# Patient Record
Sex: Female | Born: 1950 | Race: White | Hispanic: No | Marital: Single | Smoking: Never smoker
Health system: Southern US, Community
[De-identification: ages and names within clinical notes are randomized; demographics above are authoritative.]

## PROBLEM LIST (undated history)

## (undated) DIAGNOSIS — C801 Malignant (primary) neoplasm, unspecified: Secondary | ICD-10-CM

## (undated) DIAGNOSIS — I10 Essential (primary) hypertension: Secondary | ICD-10-CM

## (undated) DIAGNOSIS — E079 Disorder of thyroid, unspecified: Secondary | ICD-10-CM

## (undated) DIAGNOSIS — E119 Type 2 diabetes mellitus without complications: Secondary | ICD-10-CM

## (undated) HISTORY — DX: Disorder of thyroid, unspecified: E07.9

## (undated) HISTORY — DX: Type 2 diabetes mellitus without complications: E11.9

## (undated) HISTORY — DX: Malignant (primary) neoplasm, unspecified: C80.1

## (undated) HISTORY — DX: Essential (primary) hypertension: I10

---

## 2006-06-15 DIAGNOSIS — C801 Malignant (primary) neoplasm, unspecified: Secondary | ICD-10-CM

## 2006-06-15 HISTORY — DX: Malignant (primary) neoplasm, unspecified: C80.1

## 2006-06-15 HISTORY — PX: BREAST LUMPECTOMY: SHX2

## 2010-12-25 DIAGNOSIS — C50919 Malignant neoplasm of unspecified site of unspecified female breast: Secondary | ICD-10-CM | POA: Insufficient documentation

## 2010-12-26 ENCOUNTER — Other Ambulatory Visit: Payer: Self-pay | Admitting: Family Medicine

## 2010-12-26 DIAGNOSIS — Z1231 Encounter for screening mammogram for malignant neoplasm of breast: Secondary | ICD-10-CM

## 2011-03-02 ENCOUNTER — Other Ambulatory Visit: Payer: Self-pay | Admitting: Family Medicine

## 2011-03-02 ENCOUNTER — Ambulatory Visit
Admission: RE | Admit: 2011-03-02 | Discharge: 2011-03-02 | Disposition: A | Discharge status: Home / Self Care | Payer: Self-pay | Source: Ambulatory Visit | Attending: Family Medicine | Admitting: Family Medicine

## 2011-03-02 DIAGNOSIS — Z1231 Encounter for screening mammogram for malignant neoplasm of breast: Secondary | ICD-10-CM

## 2011-03-02 DIAGNOSIS — Z853 Personal history of malignant neoplasm of breast: Secondary | ICD-10-CM

## 2012-05-25 DIAGNOSIS — R739 Hyperglycemia, unspecified: Secondary | ICD-10-CM | POA: Insufficient documentation

## 2012-05-25 DIAGNOSIS — E782 Mixed hyperlipidemia: Secondary | ICD-10-CM | POA: Insufficient documentation

## 2012-06-27 ENCOUNTER — Other Ambulatory Visit: Payer: Self-pay | Admitting: Family Medicine

## 2012-06-27 DIAGNOSIS — N63 Unspecified lump in unspecified breast: Secondary | ICD-10-CM

## 2012-07-07 ENCOUNTER — Ambulatory Visit
Admission: RE | Admit: 2012-07-07 | Discharge: 2012-07-07 | Disposition: A | Discharge status: Home / Self Care | Payer: No Typology Code available for payment source | Source: Ambulatory Visit | Attending: Family Medicine | Admitting: Family Medicine

## 2012-07-07 DIAGNOSIS — N63 Unspecified lump in unspecified breast: Secondary | ICD-10-CM

## 2013-08-15 DIAGNOSIS — I10 Essential (primary) hypertension: Secondary | ICD-10-CM | POA: Insufficient documentation

## 2013-08-15 DIAGNOSIS — E039 Hypothyroidism, unspecified: Secondary | ICD-10-CM | POA: Insufficient documentation

## 2013-10-18 ENCOUNTER — Other Ambulatory Visit: Payer: Self-pay | Admitting: Family Medicine

## 2013-10-18 DIAGNOSIS — Z853 Personal history of malignant neoplasm of breast: Secondary | ICD-10-CM

## 2013-11-08 ENCOUNTER — Ambulatory Visit
Admission: RE | Admit: 2013-11-08 | Discharge: 2013-11-08 | Disposition: A | Discharge status: Home / Self Care | Payer: No Typology Code available for payment source | Source: Ambulatory Visit | Attending: Family Medicine | Admitting: Family Medicine

## 2013-11-08 DIAGNOSIS — Z853 Personal history of malignant neoplasm of breast: Secondary | ICD-10-CM

## 2015-03-19 ENCOUNTER — Other Ambulatory Visit: Payer: Self-pay | Admitting: Family Medicine

## 2015-03-19 DIAGNOSIS — M549 Dorsalgia, unspecified: Secondary | ICD-10-CM

## 2015-04-17 ENCOUNTER — Ambulatory Visit
Admission: RE | Admit: 2015-04-17 | Discharge: 2015-04-17 | Disposition: A | Discharge status: Home / Self Care | Payer: No Typology Code available for payment source | Source: Ambulatory Visit | Attending: Family Medicine | Admitting: Family Medicine

## 2015-04-17 DIAGNOSIS — M549 Dorsalgia, unspecified: Secondary | ICD-10-CM

## 2015-07-09 ENCOUNTER — Other Ambulatory Visit (HOSPITAL_COMMUNITY)
Admission: RE | Admit: 2015-07-09 | Discharge: 2015-07-09 | Disposition: A | Discharge status: Home / Self Care | Payer: No Typology Code available for payment source | Source: Ambulatory Visit | Attending: Family Medicine | Admitting: Family Medicine

## 2015-07-09 ENCOUNTER — Other Ambulatory Visit: Payer: Self-pay | Admitting: Family Medicine

## 2015-07-09 DIAGNOSIS — Z01419 Encounter for gynecological examination (general) (routine) without abnormal findings: Secondary | ICD-10-CM | POA: Insufficient documentation

## 2015-07-10 LAB — CYTOLOGY - PAP

## 2016-06-15 HISTORY — PX: BREAST BIOPSY: SHX20

## 2016-09-07 ENCOUNTER — Other Ambulatory Visit: Payer: Self-pay | Admitting: Family

## 2016-09-07 DIAGNOSIS — Z853 Personal history of malignant neoplasm of breast: Secondary | ICD-10-CM

## 2016-09-07 DIAGNOSIS — Z1231 Encounter for screening mammogram for malignant neoplasm of breast: Secondary | ICD-10-CM

## 2016-09-22 DIAGNOSIS — I10 Essential (primary) hypertension: Secondary | ICD-10-CM | POA: Diagnosis not present

## 2016-09-22 DIAGNOSIS — E119 Type 2 diabetes mellitus without complications: Secondary | ICD-10-CM | POA: Diagnosis not present

## 2016-09-22 DIAGNOSIS — E039 Hypothyroidism, unspecified: Secondary | ICD-10-CM | POA: Diagnosis not present

## 2016-09-22 DIAGNOSIS — Z23 Encounter for immunization: Secondary | ICD-10-CM | POA: Diagnosis not present

## 2016-09-30 ENCOUNTER — Ambulatory Visit
Admission: RE | Admit: 2016-09-30 | Discharge: 2016-09-30 | Disposition: A | Discharge status: Home / Self Care | Payer: Medicare Other | Source: Ambulatory Visit | Attending: Family | Admitting: Family

## 2016-09-30 DIAGNOSIS — Z853 Personal history of malignant neoplasm of breast: Secondary | ICD-10-CM

## 2016-09-30 DIAGNOSIS — Z1231 Encounter for screening mammogram for malignant neoplasm of breast: Secondary | ICD-10-CM

## 2016-10-02 ENCOUNTER — Other Ambulatory Visit: Payer: Self-pay | Admitting: Family

## 2016-10-02 DIAGNOSIS — R928 Other abnormal and inconclusive findings on diagnostic imaging of breast: Secondary | ICD-10-CM

## 2016-10-07 ENCOUNTER — Other Ambulatory Visit: Payer: Self-pay | Admitting: Family

## 2016-10-07 ENCOUNTER — Ambulatory Visit
Admission: RE | Admit: 2016-10-07 | Discharge: 2016-10-07 | Disposition: A | Discharge status: Home / Self Care | Payer: Medicare Other | Source: Ambulatory Visit | Attending: Family | Admitting: Family

## 2016-10-07 DIAGNOSIS — R921 Mammographic calcification found on diagnostic imaging of breast: Secondary | ICD-10-CM

## 2016-10-07 DIAGNOSIS — R928 Other abnormal and inconclusive findings on diagnostic imaging of breast: Secondary | ICD-10-CM

## 2016-10-08 ENCOUNTER — Ambulatory Visit
Admission: RE | Admit: 2016-10-08 | Discharge: 2016-10-08 | Disposition: A | Discharge status: Home / Self Care | Payer: Medicare Other | Source: Ambulatory Visit | Attending: Family | Admitting: Family

## 2016-10-08 ENCOUNTER — Other Ambulatory Visit: Payer: Self-pay | Admitting: Family

## 2016-10-08 DIAGNOSIS — R921 Mammographic calcification found on diagnostic imaging of breast: Secondary | ICD-10-CM

## 2016-10-08 DIAGNOSIS — N6489 Other specified disorders of breast: Secondary | ICD-10-CM | POA: Diagnosis not present

## 2016-10-09 DIAGNOSIS — Z0000 Encounter for general adult medical examination without abnormal findings: Secondary | ICD-10-CM | POA: Diagnosis not present

## 2016-10-09 DIAGNOSIS — Z0184 Encounter for antibody response examination: Secondary | ICD-10-CM | POA: Diagnosis not present

## 2017-02-26 DIAGNOSIS — E119 Type 2 diabetes mellitus without complications: Secondary | ICD-10-CM | POA: Diagnosis not present

## 2017-02-26 DIAGNOSIS — M545 Low back pain: Secondary | ICD-10-CM | POA: Diagnosis not present

## 2017-02-26 DIAGNOSIS — E039 Hypothyroidism, unspecified: Secondary | ICD-10-CM | POA: Diagnosis not present

## 2017-05-31 DIAGNOSIS — I10 Essential (primary) hypertension: Secondary | ICD-10-CM | POA: Diagnosis not present

## 2017-05-31 DIAGNOSIS — E039 Hypothyroidism, unspecified: Secondary | ICD-10-CM | POA: Diagnosis not present

## 2017-05-31 DIAGNOSIS — Z1211 Encounter for screening for malignant neoplasm of colon: Secondary | ICD-10-CM | POA: Diagnosis not present

## 2017-05-31 DIAGNOSIS — E119 Type 2 diabetes mellitus without complications: Secondary | ICD-10-CM | POA: Diagnosis not present

## 2017-05-31 DIAGNOSIS — Z79899 Other long term (current) drug therapy: Secondary | ICD-10-CM | POA: Diagnosis not present

## 2017-07-07 DIAGNOSIS — Z1211 Encounter for screening for malignant neoplasm of colon: Secondary | ICD-10-CM | POA: Diagnosis not present

## 2018-04-20 ENCOUNTER — Telehealth: Payer: Self-pay | Admitting: Obstetrics and Gynecology

## 2018-04-20 NOTE — Telephone Encounter (Signed)
Called and left a message for patient to call back to schedule a new patient doctor referral appointment with our office to see Dr. Quincy Simmonds to establish care. Confirm referral reason with patient.

## 2018-05-09 ENCOUNTER — Ambulatory Visit (INDEPENDENT_AMBULATORY_CARE_PROVIDER_SITE_OTHER): Payer: Medicare Other | Admitting: Obstetrics and Gynecology

## 2018-05-09 ENCOUNTER — Other Ambulatory Visit: Payer: Self-pay

## 2018-05-09 ENCOUNTER — Other Ambulatory Visit (HOSPITAL_COMMUNITY)
Admission: RE | Admit: 2018-05-09 | Discharge: 2018-05-09 | Disposition: A | Discharge status: Home / Self Care | Payer: Medicare Other | Source: Ambulatory Visit | Attending: Obstetrics and Gynecology | Admitting: Obstetrics and Gynecology

## 2018-05-09 ENCOUNTER — Other Ambulatory Visit: Payer: Self-pay | Admitting: Family Medicine

## 2018-05-09 ENCOUNTER — Encounter: Payer: Self-pay | Admitting: Obstetrics and Gynecology

## 2018-05-09 VITALS — BP 164/100 | HR 70 | Resp 18 | Ht 63.0 in | Wt 132.8 lb

## 2018-05-09 DIAGNOSIS — Z1231 Encounter for screening mammogram for malignant neoplasm of breast: Secondary | ICD-10-CM

## 2018-05-09 DIAGNOSIS — Z01419 Encounter for gynecological examination (general) (routine) without abnormal findings: Secondary | ICD-10-CM | POA: Insufficient documentation

## 2018-05-09 DIAGNOSIS — Z113 Encounter for screening for infections with a predominantly sexual mode of transmission: Secondary | ICD-10-CM

## 2018-05-09 DIAGNOSIS — R3915 Urgency of urination: Secondary | ICD-10-CM

## 2018-05-09 LAB — POCT URINALYSIS DIPSTICK
Bilirubin, UA: NEGATIVE
Blood, UA: NEGATIVE
Glucose, UA: NEGATIVE
Ketones, UA: NEGATIVE
Leukocytes, UA: NEGATIVE
Nitrite, UA: NEGATIVE
Protein, UA: NEGATIVE
Urobilinogen, UA: 0.2 E.U./dL
pH, UA: 5 (ref 5.0–8.0)

## 2018-05-09 NOTE — Progress Notes (Signed)
67 y.o. [id number] Single Caucasian/Brazilian female here for annual exam.    Daughter present for a large portion of the visit.   Difficulty emptying her bladder at night for a long time, comes and goes, but returned about 2 months ago. She has urgency when she needs to void.  During the day is urinating normally.  DF - every 1 - 1.5 hours. NF - 2 times per night.  No enuresis.  Denies incontinence.   No hx UTI/pyelo.  No hx hematuria or stones.   Some discomfort in pelvis.  Has some right sided back pain when she is trying to empty her bladder and cannot.   Had a normal pelvic US 2014 in [place], [place].  In the past had hx of fibroids.   No vaginal bleeding.   Hx DM.  A1C about 6.7.  Monitoring her sugar when she cannot empty well, and her level is in the 90s.   Last sexual activity was 5 years ago.  Did not use condoms.  No prior STD testing.   PCP:  Riley Lam, MD   Patient's last menstrual period was 06/15/1998.           Sexually active: No.  The current method of family planning is post menopausal status.    Exercising: Yes.    cardo and weights Smoker:  no  Health Maintenance: Pap:  2017 normal per patient History of abnormal Pap:  no MMG:--Hx Lt.Br.CA & lumpectomy 2008--  09-30-16 Lt.Br.calcifications, Rt.Br.Neg/density B--Diag.Lt.Br.shows grouped amorphous calcifications--Lt.Br.Core Bx Path--FIBROADENOMATOID CHANGE WITH CALCIFICATIONS OF LEFT BREAST.  Has mammogram scheduled at [facility] this week.  Colonoscopy: 2006 normal per patient--recent neg IFOB BMD: 02/2018  Result :Osteopenia with PCP TDaP: PCP Gardasil:   no HIV: Pt.thinks had and was neg Hep C:Pt. Thinks had and was neg Screening Labs:  Hb today: PCP, Urine today: negative.    reports that she has never smoked. She has never used smokeless tobacco. She reports that she drank alcohol. She reports that she does not use drugs.  Past Medical History:  Diagnosis Date  . Cancer [facility]) 2008   Lt.Br.Cance  . Diabetes mellitus without complication (HCC)    Type 2  . Hypertension   . Thyroid disease     Past Surgical History:  Procedure Laterality Date  . BREAST LUMPECTOMY Left 2008   w/radiation--done in [place]    Current Outpatient Medications  Medication Sig Dispense Refill  . levothyroxine (SYNTHROID, LEVOTHROID) 50 MCG tablet Take 1 tablet by mouth daily.    . metFORMIN (GLUCOPHAGE) 1000 MG tablet Take 1 tablet by mouth 2 (two) times daily.    . metoprolol tartrate (LOPRESSOR) 50 MG tablet Take 1 tablet by mouth daily.    . simvastatin (ZOCOR) 20 MG tablet Take 1 tablet by mouth daily.     No current facility-administered medications for this visit.     Family History  Problem Relation Age of Onset  . Hypertension Paternal Grandmother   . Diabetes Paternal Grandmother   . Hypertension Paternal Grandfather   . Breast cancer Neg Hx     Review of Systems  Genitourinary: Positive for urgency.  All other systems reviewed and are negative.   Exam:   BP (!) 164/100 (BP Location: Right Arm, Patient Position: Sitting, Cuff Size: Normal)   Pulse 70   Resp 18   Ht 5\' 3"  (1.6 m)   Wt 132 lb 12.8 oz (60.2 kg)   LMP 06/15/1998   BMI 23.52 kg/m  General appearance: alert, cooperative and appears stated age Head: Normocephalic, without obvious abnormality, atraumatic Neck: no adenopathy, supple, symmetrical, trachea midline and thyroid normal to inspection and palpation Lungs: clear to auscultation bilaterally Breasts: right - normal appearance, no masses or tenderness, No nipple retraction or dimpling, No nipple discharge or bleeding, No axillary or supraclavicular adenopathy Left - scar consistent with prior breast surgery.  no masses or tenderness, No nipple discharge or bleeding, No axillary or supraclavicular adenopathy Heart: regular rate and rhythm Abdomen: soft, non-tender; no masses, no organomegaly Extremities: extremities normal, atraumatic, no  cyanosis or edema Skin: Skin color, texture, turgor normal. No rashes or lesions Lymph nodes: Cervical, supraclavicular, and axillary nodes normal. No abnormal inguinal nodes palpated Neurologic: Grossly normal  Pelvic: External genitalia:  no lesions              Urethra:  normal appearing urethra with no masses, tenderness or lesions              Bartholins and Skenes: normal                 Vagina: normal appearing vagina with normal color and discharge, no lesions              Cervix: no lesions              Pap taken: Yes.   Bimanual Exam:  Uterus:  normal size, contour, position, consistency, mobility, non-tender              Adnexa: no mass, fullness, tenderness              Rectal exam: Yes.  .  Confirms.              Anus:  normal sphincter tone, no lesions  Chaperone was present for exam.  Assessment:   Well woman visit with  Gynecologic exam. Hx left breast cancer.  Urinary urgency.  Voiding difficulty.  DM. Osteopenia.  Followed by PCP.  HTN.   Plan: Mammogram screening. Recommended self breast awareness. Pap and HR HPV as above. Guidelines for Calcium, Vitamin D, regular exercise program including cardiovascular and weight bearing exercise. STD screening.  May need referral to urology.  Follow up annually and prn.   After visit summary provided.

## 2018-05-09 NOTE — Patient Instructions (Signed)

## 2018-05-10 LAB — HEP, RPR, HIV PANEL
HIV Screen 4th Generation wRfx: NONREACTIVE
Hepatitis B Surface Ag: NEGATIVE
RPR Ser Ql: NONREACTIVE

## 2018-05-10 LAB — HEPATITIS C ANTIBODY: Hep C Virus Ab: <0.1 s/co ratio (ref 0.0–0.9)

## 2018-05-11 ENCOUNTER — Ambulatory Visit
Admission: RE | Admit: 2018-05-11 | Discharge: 2018-05-11 | Disposition: A | Discharge status: Home / Self Care | Payer: Medicare Other | Source: Ambulatory Visit | Attending: Family Medicine | Admitting: Family Medicine

## 2018-05-11 DIAGNOSIS — Z1231 Encounter for screening mammogram for malignant neoplasm of breast: Secondary | ICD-10-CM

## 2018-05-11 LAB — CYTOLOGY - PAP
Chlamydia: NEGATIVE
Diagnosis: NEGATIVE
Neisseria Gonorrhea: NEGATIVE
Trichomonas: NEGATIVE

## 2018-05-12 ENCOUNTER — Encounter: Payer: Self-pay | Admitting: Obstetrics and Gynecology

## 2018-05-12 DIAGNOSIS — R3915 Urgency of urination: Secondary | ICD-10-CM | POA: Insufficient documentation

## 2018-05-17 ENCOUNTER — Telehealth: Payer: Self-pay

## 2018-05-17 NOTE — Telephone Encounter (Signed)
Spoke with patient. Results given. Patient verbalizes understanding. 02 recall entered. Encounter closed.

## 2018-05-17 NOTE — Telephone Encounter (Signed)
-----   Message from Nunzio Cobbs, MD sent at 05/13/2018  2:09 PM EST ----- Please report results to patient.  You may need a [place] interpretor but I would try calling first. Her pap is normal. Her testing for infectious disease is all negative for HIV, syphilis, hepatitis B and C, trichomonas, gonorrhea, and chlamydia.

## 2019-04-11 ENCOUNTER — Other Ambulatory Visit: Payer: Self-pay | Admitting: Family Medicine

## 2019-04-11 DIAGNOSIS — Z1231 Encounter for screening mammogram for malignant neoplasm of breast: Secondary | ICD-10-CM

## 2019-06-01 ENCOUNTER — Ambulatory Visit
Admission: RE | Admit: 2019-06-01 | Discharge: 2019-06-01 | Disposition: A | Discharge status: Home / Self Care | Payer: Medicare Other | Source: Ambulatory Visit | Attending: Family Medicine | Admitting: Family Medicine

## 2019-06-01 ENCOUNTER — Other Ambulatory Visit: Payer: Self-pay

## 2019-06-01 ENCOUNTER — Ambulatory Visit: Payer: Medicare Other

## 2019-06-01 DIAGNOSIS — Z1231 Encounter for screening mammogram for malignant neoplasm of breast: Secondary | ICD-10-CM

## 2020-05-27 ENCOUNTER — Other Ambulatory Visit: Payer: Self-pay | Admitting: Family Medicine

## 2020-05-27 DIAGNOSIS — Z1231 Encounter for screening mammogram for malignant neoplasm of breast: Secondary | ICD-10-CM

## 2020-07-08 ENCOUNTER — Ambulatory Visit
Admission: RE | Admit: 2020-07-08 | Discharge: 2020-07-08 | Disposition: A | Discharge status: Home / Self Care | Payer: Medicare Other | Source: Ambulatory Visit | Attending: Family Medicine | Admitting: Family Medicine

## 2020-07-08 ENCOUNTER — Other Ambulatory Visit: Payer: Self-pay

## 2020-07-08 DIAGNOSIS — Z1231 Encounter for screening mammogram for malignant neoplasm of breast: Secondary | ICD-10-CM

## 2020-07-18 DIAGNOSIS — D1801 Hemangioma of skin and subcutaneous tissue: Secondary | ICD-10-CM | POA: Diagnosis not present

## 2020-07-18 DIAGNOSIS — L814 Other melanin hyperpigmentation: Secondary | ICD-10-CM | POA: Diagnosis not present

## 2020-07-18 DIAGNOSIS — D239 Other benign neoplasm of skin, unspecified: Secondary | ICD-10-CM | POA: Diagnosis not present

## 2020-07-18 DIAGNOSIS — L720 Epidermal cyst: Secondary | ICD-10-CM | POA: Diagnosis not present

## 2020-07-18 DIAGNOSIS — L821 Other seborrheic keratosis: Secondary | ICD-10-CM | POA: Diagnosis not present

## 2020-08-19 ENCOUNTER — Ambulatory Visit (INDEPENDENT_AMBULATORY_CARE_PROVIDER_SITE_OTHER): Payer: Medicare Other | Admitting: Family Medicine

## 2020-08-19 ENCOUNTER — Other Ambulatory Visit: Payer: Self-pay

## 2020-08-19 ENCOUNTER — Encounter: Payer: Self-pay | Admitting: Family Medicine

## 2020-08-19 VITALS — BP 173/80 | HR 72 | Ht 63.25 in | Wt 126.6 lb

## 2020-08-19 DIAGNOSIS — E039 Hypothyroidism, unspecified: Secondary | ICD-10-CM | POA: Diagnosis not present

## 2020-08-19 DIAGNOSIS — I10 Essential (primary) hypertension: Secondary | ICD-10-CM

## 2020-08-19 DIAGNOSIS — E2839 Other primary ovarian failure: Secondary | ICD-10-CM | POA: Insufficient documentation

## 2020-08-19 DIAGNOSIS — E782 Mixed hyperlipidemia: Secondary | ICD-10-CM

## 2020-08-19 DIAGNOSIS — E7800 Pure hypercholesterolemia, unspecified: Secondary | ICD-10-CM | POA: Insufficient documentation

## 2020-08-19 DIAGNOSIS — E559 Vitamin D deficiency, unspecified: Secondary | ICD-10-CM

## 2020-08-19 DIAGNOSIS — E119 Type 2 diabetes mellitus without complications: Secondary | ICD-10-CM | POA: Diagnosis not present

## 2020-08-19 DIAGNOSIS — Z853 Personal history of malignant neoplasm of breast: Secondary | ICD-10-CM

## 2020-08-19 MED ORDER — METOPROLOL TARTRATE 50 MG PO TABS: 50.0000 mg | ORAL_TABLET | Freq: Two times a day (BID) | ORAL | 1 refills | Status: AC

## 2020-08-19 MED ORDER — METFORMIN HCL 1000 MG PO TABS: 1000.0000 mg | ORAL_TABLET | Freq: Two times a day (BID) | ORAL | 1 refills | Status: DC

## 2020-08-19 NOTE — Progress Notes (Signed)
Office Visit Note   Patient: Amanda Lucas           Date of Birth: [birth year]           MRN: [id number] Visit Date: 08/19/2020 Requested by: Lujean Amel, MD [place],  [place] [id number] PCP: Eunice Blase, MD  Subjective: Chief Complaint  Patient presents with  . Other    Establish primary care Would like some blood work - fasting today    HPI: She's here to establish care.  Feels uncomfortable now when seeing her previous PCP, since she told her she didn't want vaccination.  Her daughter is with her to interpret today.  She has diabetes which has been well controlled with Metformin and lifestyle changes.  She eats very healthfully and rarely ever "cheats" with her diet.  She exercises regularly as well.  No history of retinopathy, nephropathy or neuropathy.  Blood sugars at home are generally below 120 in the mornings.  Her last A1c was around 6.  Her blood pressure is normal when she checks it at home but always elevated when she comes to the doctor.  She is asymptomatic.  She was placed on pravastatin although her lipids were basically normal.  She recently stopped it due to concerns about side effects.  Hypothyroidism has been well controlled.  No current symptoms on 50 mcg daily.  History of breast cancer, treated successfully.  Monitored yearly with mammograms.  Family history reviewed.               ROS:   All other systems were reviewed and are negative.  Objective: Vital Signs: BP (!) 173/80   Pulse 72   Ht 5' 3.25" (1.607 m)   Wt 126 lb 9.6 oz (57.4 kg)   LMP 06/15/1998   BMI 22.25 kg/m   Physical Exam:  General:  Alert and oriented, in no acute distress. Pulm:  Breathing unlabored. Psy:  Normal mood, congruent affect. Skin:  No lesions, including feet.  HEENT:  [place]/AT, PERRLA, EOM Full, no nystagmus.  Funduscopic examination within normal limits.  No conjunctival erythema.  Tympanic membranes are pearly gray with  normal landmarks.  External ear canals are normal.  Nasal passages are clear.  Oropharynx is clear.  No significant lymphadenopathy.  No thyromegaly or nodules.  2+ carotid pulses without bruits. CV: Regular rate and rhythm without murmurs, rubs, or gallops.  No peripheral edema.  2+ radial and posterior tibial pulses. Lungs: Clear to auscultation throughout with no wheezing or areas of consolidation. Abd: Bowel sounds are active, no hepatosplenomegaly or masses.  Soft and nontender.  No audible bruits.  No evidence of ascites. Ext:  2+ UE and LE DTRs.   Imaging: No results found.  Assessment & Plan: 1.  Diabetes, under good control. - Labs today.  Return in 6 months.  2.  Hypothyroid, asymptomatic. - Labs, adjust if needed.  3.  Hypertension - Bring home machine in for calibration.  4.  History of breast cancer - Monitored regularly.     Procedures: No procedures performed        PMFS History: Patient Active Problem List   Diagnosis Date Noted  . Type 2 diabetes mellitus without complications ([place]) 68/05/7516  . Decreased estrogen level 08/19/2020  . Personal history of malignant neoplasm of breast 08/19/2020  . Pure hypercholesterolemia 08/19/2020  . Urinary urgency 05/12/2018  . Essential (primary) hypertension 08/15/2013  . Hypothyroidism, unspecified 08/15/2013  . Mixed hyperlipidemia 05/25/2012  .  Hyperglycemia 05/25/2012  . Malignant neoplasm of female breast ([place]) 12/25/2010   Past Medical History:  Diagnosis Date  . Cancer [facility]) 2008   Lt.Br.Cance  . Diabetes mellitus without complication (HCC)    Type 2  . Hypertension   . Thyroid disease     Family History  Problem Relation Age of Onset  . Cancer Mother   . Brain cancer Mother   . Cancer Father   . Stomach cancer Father   . Healthy Sister   . Healthy Brother   . Hypertension Paternal Grandmother   . Diabetes Paternal Grandmother   . Hypertension Paternal Grandfather   . Healthy Brother   .  Diabetes Paternal Aunt   . Breast cancer Neg Hx   . Heart disease Neg Hx     Past Surgical History:  Procedure Laterality Date  . BREAST LUMPECTOMY Left 2008   w/radiation--done in [place] History   Occupational History  . Not on file  Tobacco Use  . Smoking status: Never Smoker  . Smokeless tobacco: Never Used  Vaping Use  . Vaping Use: Never used  Substance and Sexual Activity  . Alcohol use: Not Currently  . Drug use: Never  . Sexual activity: Not Currently    Birth control/protection: Post-menopausal

## 2020-08-20 ENCOUNTER — Telehealth: Payer: Self-pay | Admitting: Family Medicine

## 2020-08-20 LAB — CBC WITH DIFFERENTIAL/PLATELET
Absolute Monocytes: 475 cells/uL (ref 200–950)
Basophils Absolute: 39 cells/uL (ref 0–200)
Basophils Relative: 0.6 %
Eosinophils Absolute: 98 cells/uL (ref 15–500)
Eosinophils Relative: 1.5 %
HCT: 43.7 % (ref 35.0–45.0)
Hemoglobin: 14.7 g/dL (ref 11.7–15.5)
Lymphs Abs: 2158 cells/uL (ref 850–3900)
MCH: 29.6 pg (ref 27.0–33.0)
MCHC: 33.6 g/dL (ref 32.0–36.0)
MCV: 87.9 fL (ref 80.0–100.0)
MPV: 9.1 fL (ref 7.5–12.5)
Monocytes Relative: 7.3 %
Neutro Abs: 3731 cells/uL (ref 1500–7800)
Neutrophils Relative %: 57.4 %
Platelets: 200 10*3/uL (ref 140–400)
RBC: 4.97 10*6/uL (ref 3.80–5.10)
RDW: 13 % (ref 11.0–15.0)
Total Lymphocyte: 33.2 %
WBC: 6.5 10*3/uL (ref 3.8–10.8)

## 2020-08-20 LAB — COMPREHENSIVE METABOLIC PANEL
AG Ratio: 2.1 (calc) (ref 1.0–2.5)
ALT: 9 U/L (ref 6–29)
AST: 13 U/L (ref 10–35)
Albumin: 4.6 g/dL (ref 3.6–5.1)
Alkaline phosphatase (APISO): 61 U/L (ref 37–153)
BUN: 18 mg/dL (ref 7–25)
CO2: 27 mmol/L (ref 20–32)
Calcium: 9.6 mg/dL (ref 8.6–10.4)
Chloride: 103 mmol/L (ref 98–110)
Creat: 0.69 mg/dL (ref 0.50–0.99)
Globulin: 2.2 g/dL (calc) (ref 1.9–3.7)
Glucose, Bld: 102 mg/dL — ABNORMAL HIGH (ref 65–99)
Potassium: 4.5 mmol/L (ref 3.5–5.3)
Sodium: 138 mmol/L (ref 135–146)
Total Bilirubin: 0.4 mg/dL (ref 0.2–1.2)
Total Protein: 6.8 g/dL (ref 6.1–8.1)

## 2020-08-20 LAB — HIGH SENSITIVITY CRP: hs-CRP: 0.5 mg/L

## 2020-08-20 LAB — LIPID PANEL
Cholesterol: 186 mg/dL (ref <200)
HDL: 56 mg/dL (ref >=50)
LDL Cholesterol (Calc): 105 mg/dL (calc) — ABNORMAL HIGH
Non-HDL Cholesterol (Calc): 130 mg/dL (calc) — ABNORMAL HIGH (ref <130)
Total CHOL/HDL Ratio: 3.3 (calc) (ref <5.0)
Triglycerides: 134 mg/dL (ref <150)

## 2020-08-20 LAB — THYROID PANEL WITH TSH
Free Thyroxine Index: 2.3 (ref 1.4–3.8)
T3 Uptake: 26 % (ref 22–35)
T4, Total: 8.9 ug/dL (ref 5.1–11.9)
TSH: 2.41 mIU/L (ref 0.40–4.50)

## 2020-08-20 LAB — HEMOGLOBIN A1C
Hgb A1c MFr Bld: 6.3 % of total Hgb — ABNORMAL HIGH (ref <5.7)
Mean Plasma Glucose: 134 mg/dL
eAG (mmol/L): 7.4 mmol/L

## 2020-08-20 LAB — MICROALBUMIN, URINE: Microalb, Ur: <0.2 mg/dL

## 2020-08-20 LAB — VITAMIN D 25 HYDROXY (VIT D DEFICIENCY, FRACTURES): Vit D, 25-Hydroxy: 37 ng/mL (ref 30–100)

## 2020-08-20 NOTE — Telephone Encounter (Signed)
Labs show:  Vitamin D borderline at 18.  I recommend an additional vitamin D3 at 2000 IU daily to bring the level closer to 50-80.  Blood glucose looks good at 102, and A1c is still good at 6.3.  It is important to continue exercising regularly and minimizing dietary intake of processed carbohydrates and sweets.  Thyroid function looks good.  All else looks good

## 2020-09-03 ENCOUNTER — Encounter: Payer: Self-pay | Admitting: Family Medicine

## 2020-09-03 MED ORDER — DOXYCYCLINE HYCLATE 100 MG PO CAPS: 100.0000 mg | ORAL_CAPSULE | Freq: Two times a day (BID) | ORAL | 0 refills | Status: AC

## 2020-09-12 DIAGNOSIS — L089 Local infection of the skin and subcutaneous tissue, unspecified: Secondary | ICD-10-CM | POA: Diagnosis not present

## 2020-09-12 DIAGNOSIS — L720 Epidermal cyst: Secondary | ICD-10-CM | POA: Diagnosis not present

## 2020-09-26 DIAGNOSIS — L720 Epidermal cyst: Secondary | ICD-10-CM | POA: Diagnosis not present

## 2021-01-01 ENCOUNTER — Other Ambulatory Visit: Payer: Self-pay | Admitting: Family Medicine

## 2021-01-22 ENCOUNTER — Other Ambulatory Visit: Payer: Self-pay | Admitting: Family Medicine

## 2021-03-03 DIAGNOSIS — Z7984 Long term (current) use of oral hypoglycemic drugs: Secondary | ICD-10-CM | POA: Diagnosis not present

## 2021-03-03 DIAGNOSIS — I10 Essential (primary) hypertension: Secondary | ICD-10-CM | POA: Diagnosis not present

## 2021-03-03 DIAGNOSIS — E1169 Type 2 diabetes mellitus with other specified complication: Secondary | ICD-10-CM | POA: Diagnosis not present

## 2021-03-03 DIAGNOSIS — E7800 Pure hypercholesterolemia, unspecified: Secondary | ICD-10-CM | POA: Diagnosis not present

## 2021-03-03 DIAGNOSIS — R350 Frequency of micturition: Secondary | ICD-10-CM | POA: Diagnosis not present

## 2021-03-03 DIAGNOSIS — E039 Hypothyroidism, unspecified: Secondary | ICD-10-CM | POA: Diagnosis not present

## 2021-05-05 DIAGNOSIS — E1169 Type 2 diabetes mellitus with other specified complication: Secondary | ICD-10-CM | POA: Diagnosis not present

## 2021-05-05 DIAGNOSIS — E039 Hypothyroidism, unspecified: Secondary | ICD-10-CM | POA: Diagnosis not present

## 2021-05-05 DIAGNOSIS — I10 Essential (primary) hypertension: Secondary | ICD-10-CM | POA: Diagnosis not present

## 2021-05-05 DIAGNOSIS — Z7984 Long term (current) use of oral hypoglycemic drugs: Secondary | ICD-10-CM | POA: Diagnosis not present

## 2021-05-05 DIAGNOSIS — E7800 Pure hypercholesterolemia, unspecified: Secondary | ICD-10-CM | POA: Diagnosis not present

## 2021-06-25 ENCOUNTER — Other Ambulatory Visit: Payer: Self-pay | Admitting: Internal Medicine

## 2021-06-25 DIAGNOSIS — Z1231 Encounter for screening mammogram for malignant neoplasm of breast: Secondary | ICD-10-CM

## 2021-07-10 ENCOUNTER — Other Ambulatory Visit: Payer: Self-pay

## 2021-07-10 ENCOUNTER — Ambulatory Visit
Admission: RE | Admit: 2021-07-10 | Discharge: 2021-07-10 | Disposition: A | Discharge status: Home / Self Care | Payer: Medicare Other | Source: Ambulatory Visit | Attending: Internal Medicine | Admitting: Internal Medicine

## 2021-07-10 DIAGNOSIS — Z1231 Encounter for screening mammogram for malignant neoplasm of breast: Secondary | ICD-10-CM

## 2021-09-04 ENCOUNTER — Other Ambulatory Visit: Payer: Self-pay | Admitting: Internal Medicine

## 2021-09-04 DIAGNOSIS — Z853 Personal history of malignant neoplasm of breast: Secondary | ICD-10-CM | POA: Diagnosis not present

## 2021-09-04 DIAGNOSIS — Z1211 Encounter for screening for malignant neoplasm of colon: Secondary | ICD-10-CM | POA: Diagnosis not present

## 2021-09-04 DIAGNOSIS — E039 Hypothyroidism, unspecified: Secondary | ICD-10-CM | POA: Diagnosis not present

## 2021-09-04 DIAGNOSIS — M8588 Other specified disorders of bone density and structure, other site: Secondary | ICD-10-CM

## 2021-09-04 DIAGNOSIS — Z0000 Encounter for general adult medical examination without abnormal findings: Secondary | ICD-10-CM | POA: Diagnosis not present

## 2021-09-04 DIAGNOSIS — E1169 Type 2 diabetes mellitus with other specified complication: Secondary | ICD-10-CM | POA: Diagnosis not present

## 2021-09-04 DIAGNOSIS — I10 Essential (primary) hypertension: Secondary | ICD-10-CM | POA: Diagnosis not present

## 2021-09-04 DIAGNOSIS — E7800 Pure hypercholesterolemia, unspecified: Secondary | ICD-10-CM | POA: Diagnosis not present

## 2021-09-17 DIAGNOSIS — Z1211 Encounter for screening for malignant neoplasm of colon: Secondary | ICD-10-CM | POA: Diagnosis not present

## 2021-10-01 DIAGNOSIS — H524 Presbyopia: Secondary | ICD-10-CM | POA: Diagnosis not present

## 2021-10-01 DIAGNOSIS — H52223 Regular astigmatism, bilateral: Secondary | ICD-10-CM | POA: Diagnosis not present

## 2021-10-01 DIAGNOSIS — H35033 Hypertensive retinopathy, bilateral: Secondary | ICD-10-CM | POA: Diagnosis not present

## 2021-10-01 DIAGNOSIS — H5203 Hypermetropia, bilateral: Secondary | ICD-10-CM | POA: Diagnosis not present

## 2021-11-07 DIAGNOSIS — R195 Other fecal abnormalities: Secondary | ICD-10-CM | POA: Diagnosis not present

## 2022-03-10 DIAGNOSIS — Z7984 Long term (current) use of oral hypoglycemic drugs: Secondary | ICD-10-CM | POA: Diagnosis not present

## 2022-03-10 DIAGNOSIS — Z13820 Encounter for screening for osteoporosis: Secondary | ICD-10-CM | POA: Diagnosis not present

## 2022-03-10 DIAGNOSIS — E7800 Pure hypercholesterolemia, unspecified: Secondary | ICD-10-CM | POA: Diagnosis not present

## 2022-03-10 DIAGNOSIS — E1169 Type 2 diabetes mellitus with other specified complication: Secondary | ICD-10-CM | POA: Diagnosis not present

## 2022-03-10 DIAGNOSIS — I10 Essential (primary) hypertension: Secondary | ICD-10-CM | POA: Diagnosis not present

## 2022-03-10 DIAGNOSIS — E039 Hypothyroidism, unspecified: Secondary | ICD-10-CM | POA: Diagnosis not present

## 2022-03-10 DIAGNOSIS — R195 Other fecal abnormalities: Secondary | ICD-10-CM | POA: Diagnosis not present

## 2022-03-11 ENCOUNTER — Other Ambulatory Visit: Payer: Self-pay | Admitting: Family Medicine

## 2022-03-11 DIAGNOSIS — E2839 Other primary ovarian failure: Secondary | ICD-10-CM

## 2022-05-14 DIAGNOSIS — R195 Other fecal abnormalities: Secondary | ICD-10-CM | POA: Diagnosis not present

## 2022-05-28 DIAGNOSIS — K5730 Diverticulosis of large intestine without perforation or abscess without bleeding: Secondary | ICD-10-CM | POA: Diagnosis not present

## 2022-05-28 DIAGNOSIS — K648 Other hemorrhoids: Secondary | ICD-10-CM | POA: Diagnosis not present

## 2022-05-28 DIAGNOSIS — D122 Benign neoplasm of ascending colon: Secondary | ICD-10-CM | POA: Diagnosis not present

## 2022-05-28 DIAGNOSIS — K644 Residual hemorrhoidal skin tags: Secondary | ICD-10-CM | POA: Diagnosis not present

## 2022-05-28 DIAGNOSIS — R195 Other fecal abnormalities: Secondary | ICD-10-CM | POA: Diagnosis not present

## 2022-06-02 DIAGNOSIS — D122 Benign neoplasm of ascending colon: Secondary | ICD-10-CM | POA: Diagnosis not present

## 2022-06-16 ENCOUNTER — Other Ambulatory Visit: Payer: Self-pay | Admitting: Internal Medicine

## 2022-06-16 DIAGNOSIS — Z1231 Encounter for screening mammogram for malignant neoplasm of breast: Secondary | ICD-10-CM

## 2022-08-04 ENCOUNTER — Ambulatory Visit
Admission: RE | Admit: 2022-08-04 | Discharge: 2022-08-04 | Disposition: A | Discharge status: Home / Self Care | Payer: 59 | Source: Ambulatory Visit | Attending: Internal Medicine | Admitting: Internal Medicine

## 2022-08-04 DIAGNOSIS — Z1231 Encounter for screening mammogram for malignant neoplasm of breast: Secondary | ICD-10-CM

## 2022-08-24 ENCOUNTER — Ambulatory Visit
Admission: RE | Admit: 2022-08-24 | Discharge: 2022-08-24 | Disposition: A | Discharge status: Home / Self Care | Payer: 59 | Source: Ambulatory Visit | Attending: Family Medicine | Admitting: Family Medicine

## 2022-08-24 DIAGNOSIS — E2839 Other primary ovarian failure: Secondary | ICD-10-CM

## 2022-08-24 DIAGNOSIS — Z780 Asymptomatic menopausal state: Secondary | ICD-10-CM | POA: Diagnosis not present

## 2022-08-24 DIAGNOSIS — M85851 Other specified disorders of bone density and structure, right thigh: Secondary | ICD-10-CM | POA: Diagnosis not present

## 2022-08-28 DIAGNOSIS — M79672 Pain in left foot: Secondary | ICD-10-CM | POA: Diagnosis not present

## 2022-09-16 DIAGNOSIS — M7989 Other specified soft tissue disorders: Secondary | ICD-10-CM | POA: Diagnosis not present

## 2022-09-16 DIAGNOSIS — E7800 Pure hypercholesterolemia, unspecified: Secondary | ICD-10-CM | POA: Diagnosis not present

## 2022-09-16 DIAGNOSIS — E119 Type 2 diabetes mellitus without complications: Secondary | ICD-10-CM | POA: Diagnosis not present

## 2022-09-16 DIAGNOSIS — Z853 Personal history of malignant neoplasm of breast: Secondary | ICD-10-CM | POA: Diagnosis not present

## 2022-09-16 DIAGNOSIS — E039 Hypothyroidism, unspecified: Secondary | ICD-10-CM | POA: Diagnosis not present

## 2022-09-16 DIAGNOSIS — I10 Essential (primary) hypertension: Secondary | ICD-10-CM | POA: Diagnosis not present

## 2022-09-16 DIAGNOSIS — Z0000 Encounter for general adult medical examination without abnormal findings: Secondary | ICD-10-CM | POA: Diagnosis not present

## 2022-09-16 DIAGNOSIS — M8588 Other specified disorders of bone density and structure, other site: Secondary | ICD-10-CM | POA: Diagnosis not present

## 2022-09-16 DIAGNOSIS — E1169 Type 2 diabetes mellitus with other specified complication: Secondary | ICD-10-CM | POA: Diagnosis not present

## 2023-03-15 DIAGNOSIS — H524 Presbyopia: Secondary | ICD-10-CM | POA: Diagnosis not present

## 2023-03-15 DIAGNOSIS — I10 Essential (primary) hypertension: Secondary | ICD-10-CM | POA: Diagnosis not present

## 2023-03-15 DIAGNOSIS — E119 Type 2 diabetes mellitus without complications: Secondary | ICD-10-CM | POA: Diagnosis not present

## 2023-03-15 DIAGNOSIS — H5203 Hypermetropia, bilateral: Secondary | ICD-10-CM | POA: Diagnosis not present

## 2023-03-15 DIAGNOSIS — H52223 Regular astigmatism, bilateral: Secondary | ICD-10-CM | POA: Diagnosis not present

## 2023-03-18 DIAGNOSIS — M8588 Other specified disorders of bone density and structure, other site: Secondary | ICD-10-CM | POA: Diagnosis not present

## 2023-03-18 DIAGNOSIS — I10 Essential (primary) hypertension: Secondary | ICD-10-CM | POA: Diagnosis not present

## 2023-03-18 DIAGNOSIS — E039 Hypothyroidism, unspecified: Secondary | ICD-10-CM | POA: Diagnosis not present

## 2023-03-18 DIAGNOSIS — D369 Benign neoplasm, unspecified site: Secondary | ICD-10-CM | POA: Diagnosis not present

## 2023-03-18 DIAGNOSIS — E1169 Type 2 diabetes mellitus with other specified complication: Secondary | ICD-10-CM | POA: Diagnosis not present

## 2023-03-18 DIAGNOSIS — E7800 Pure hypercholesterolemia, unspecified: Secondary | ICD-10-CM | POA: Diagnosis not present

## 2023-03-18 DIAGNOSIS — R0981 Nasal congestion: Secondary | ICD-10-CM | POA: Diagnosis not present

## 2023-03-18 DIAGNOSIS — E119 Type 2 diabetes mellitus without complications: Secondary | ICD-10-CM | POA: Diagnosis not present

## 2023-05-24 IMAGING — MG MM DIGITAL SCREENING BILAT W/ TOMO AND CAD
8 series · 8 of 24 positions shown · non-contrast
Comparison: Previous exam(s).

CLINICAL DATA: Screening.

EXAM:
DIGITAL SCREENING BILATERAL MAMMOGRAM WITH TOMOSYNTHESIS AND CAD
TECHNIQUE: Bilateral screening digital craniocaudal and mediolateral oblique
mammograms were obtained. Bilateral screening digital breast
tomosynthesis was performed. The images were evaluated with
computer-aided detection.

[R CC synth-2D]
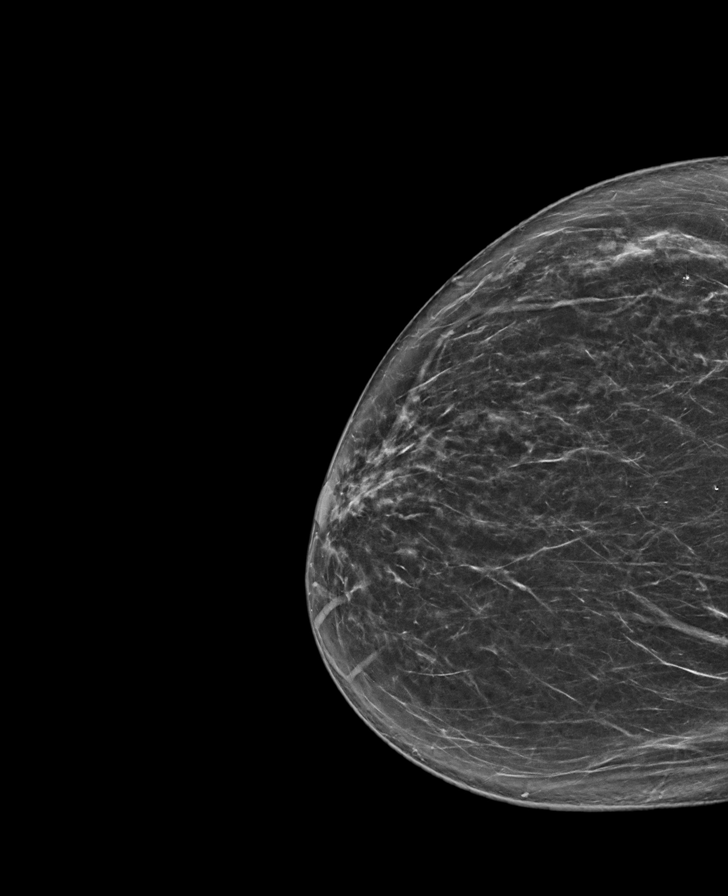

[R MLO synth-2D]
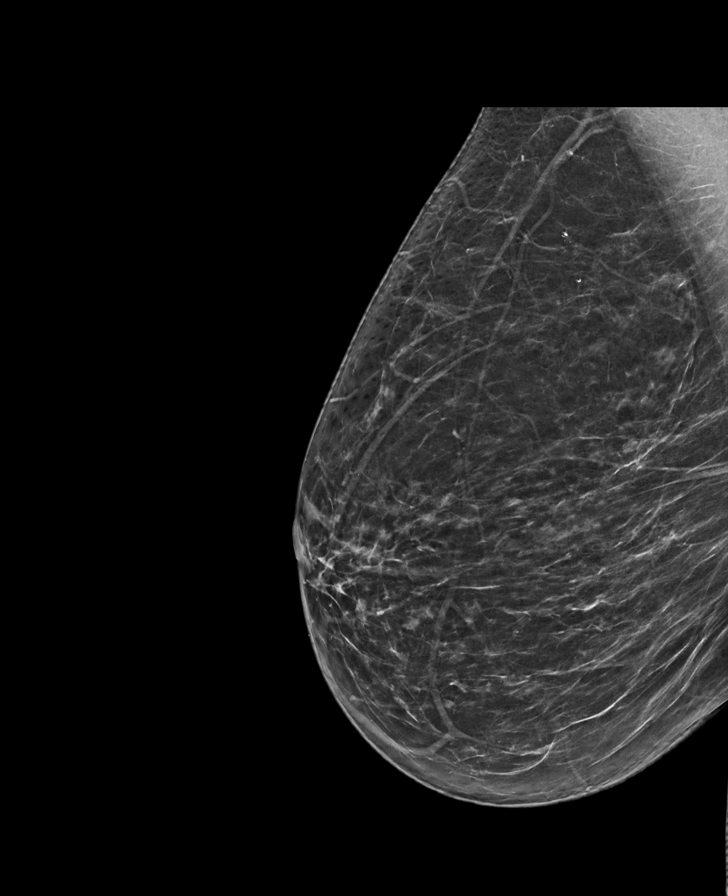

[L CC synth-2D]
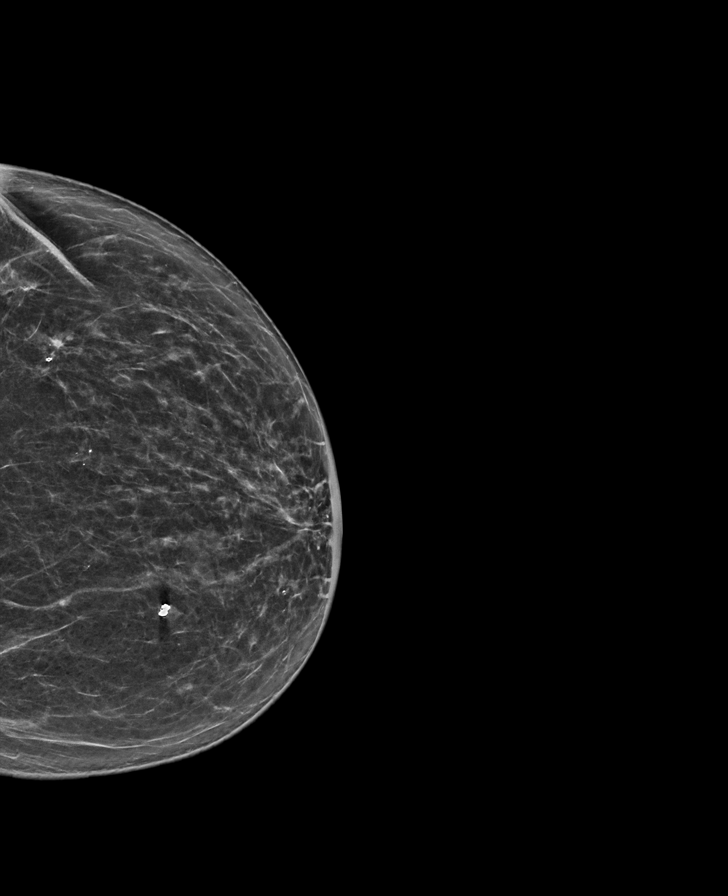

[L MLO synth-2D]
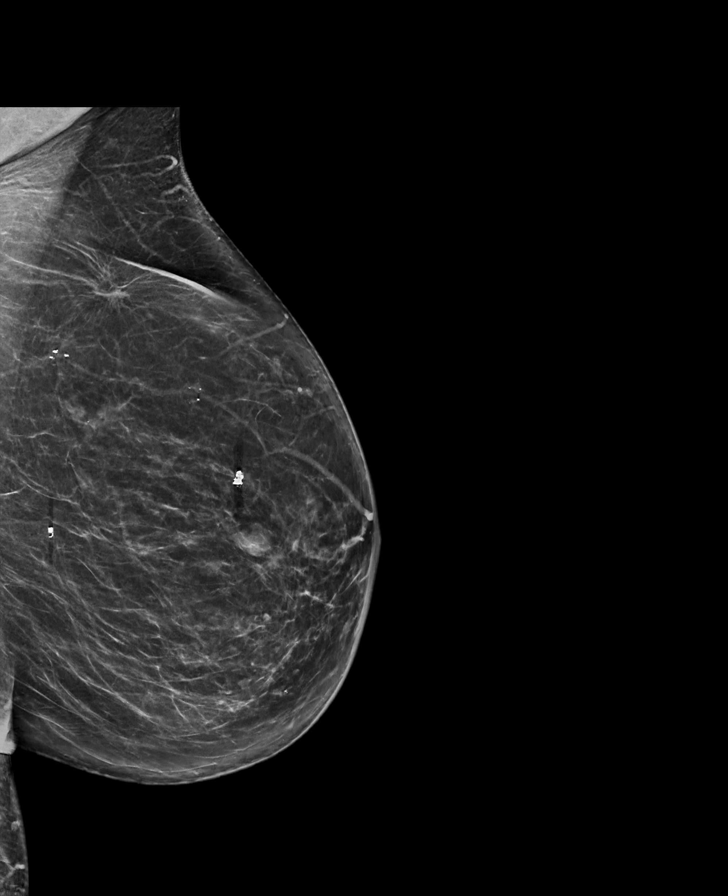

[R MLO tomo · tomo slice 37/73.0]
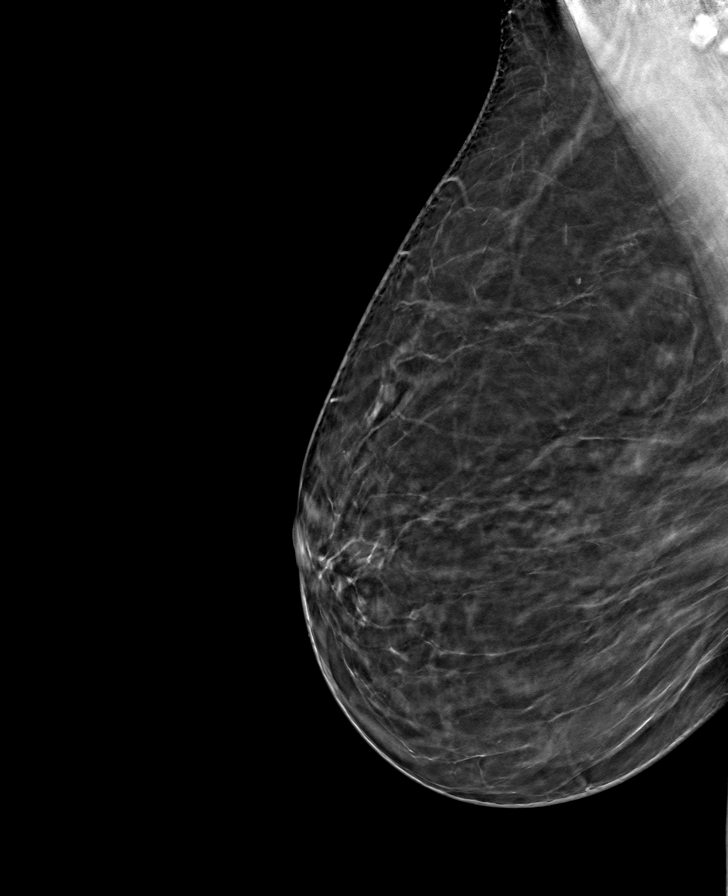

[R CC tomo · tomo slice 39/78.0]
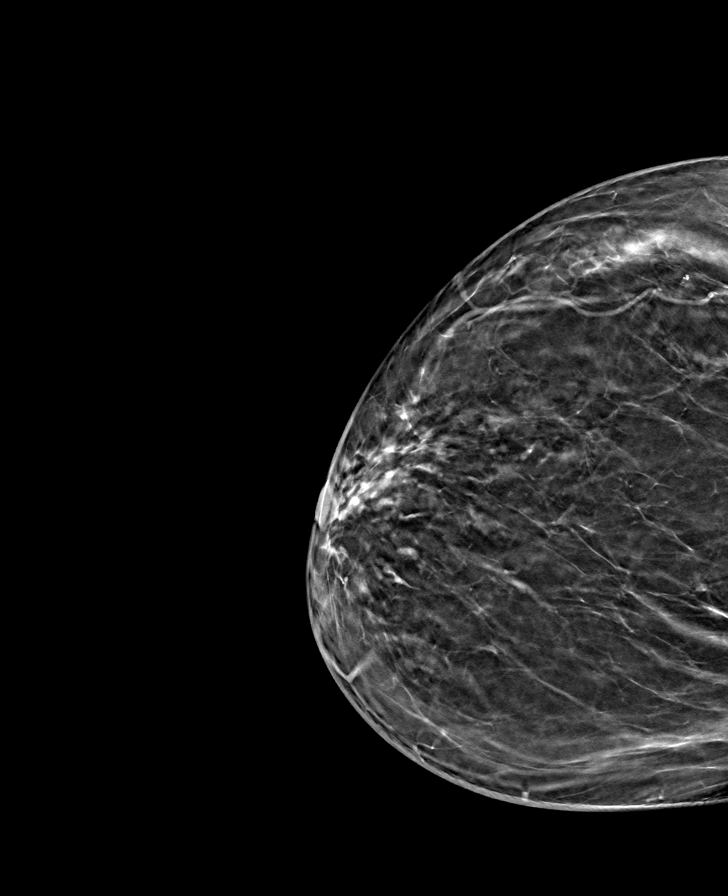

[L MLO tomo · tomo slice 37/74.0]
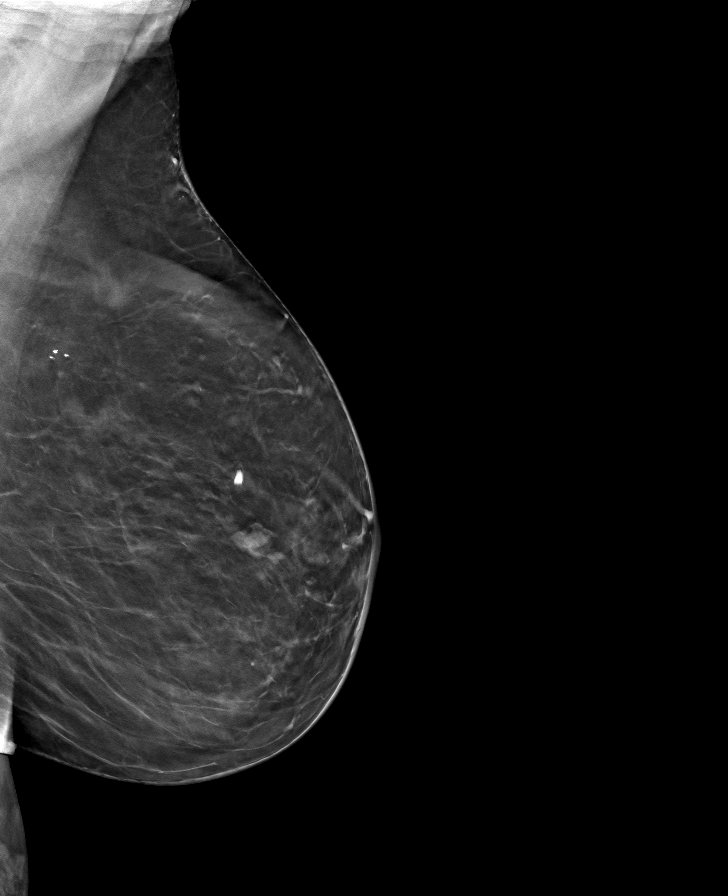

[L CC tomo · tomo slice 33/66.0]
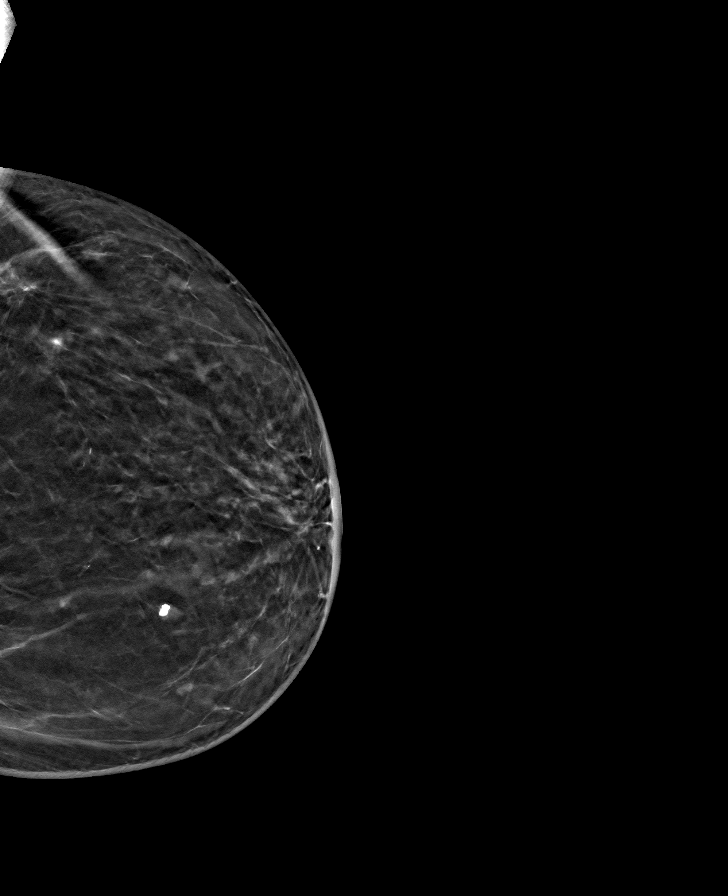

[8 of 24 positions shown; findings below may reference images not displayed]

ACR Breast Density Category b: There are scattered areas of
fibroglandular density.
FINDINGS: There are no findings suspicious for malignancy.
IMPRESSION: No mammographic evidence of malignancy. A result letter of this
screening mammogram will be mailed directly to the patient.

RECOMMENDATION:
Screening mammogram in one year. (Code:51-O-LD2)

BI-RADS CATEGORY  1: Negative.

## 2023-09-21 ENCOUNTER — Other Ambulatory Visit: Payer: Self-pay | Admitting: Internal Medicine

## 2023-09-21 ENCOUNTER — Ambulatory Visit
Admission: RE | Admit: 2023-09-21 | Discharge: 2023-09-21 | Disposition: A | Discharge status: Home / Self Care | Source: Ambulatory Visit | Attending: Internal Medicine | Admitting: Internal Medicine

## 2023-09-21 DIAGNOSIS — Z0000 Encounter for general adult medical examination without abnormal findings: Secondary | ICD-10-CM | POA: Diagnosis not present

## 2023-09-21 DIAGNOSIS — M25511 Pain in right shoulder: Secondary | ICD-10-CM | POA: Diagnosis not present

## 2023-09-21 DIAGNOSIS — E039 Hypothyroidism, unspecified: Secondary | ICD-10-CM | POA: Diagnosis not present

## 2023-09-21 DIAGNOSIS — Z23 Encounter for immunization: Secondary | ICD-10-CM | POA: Diagnosis not present

## 2023-09-21 DIAGNOSIS — M8588 Other specified disorders of bone density and structure, other site: Secondary | ICD-10-CM | POA: Diagnosis not present

## 2023-09-21 DIAGNOSIS — E7800 Pure hypercholesterolemia, unspecified: Secondary | ICD-10-CM | POA: Diagnosis not present

## 2023-09-21 DIAGNOSIS — I10 Essential (primary) hypertension: Secondary | ICD-10-CM | POA: Diagnosis not present

## 2023-09-21 DIAGNOSIS — E1169 Type 2 diabetes mellitus with other specified complication: Secondary | ICD-10-CM | POA: Diagnosis not present

## 2023-09-21 DIAGNOSIS — Z1231 Encounter for screening mammogram for malignant neoplasm of breast: Secondary | ICD-10-CM | POA: Diagnosis not present

## 2023-09-21 DIAGNOSIS — Z853 Personal history of malignant neoplasm of breast: Secondary | ICD-10-CM | POA: Diagnosis not present

## 2023-09-21 DIAGNOSIS — D369 Benign neoplasm, unspecified site: Secondary | ICD-10-CM | POA: Diagnosis not present

## 2023-10-15 DIAGNOSIS — M25511 Pain in right shoulder: Secondary | ICD-10-CM | POA: Diagnosis not present

## 2023-10-18 DIAGNOSIS — M25511 Pain in right shoulder: Secondary | ICD-10-CM | POA: Diagnosis not present

## 2023-10-21 DIAGNOSIS — M25511 Pain in right shoulder: Secondary | ICD-10-CM | POA: Diagnosis not present

## 2023-10-26 DIAGNOSIS — M25511 Pain in right shoulder: Secondary | ICD-10-CM | POA: Diagnosis not present

## 2023-10-29 DIAGNOSIS — M25511 Pain in right shoulder: Secondary | ICD-10-CM | POA: Diagnosis not present

## 2023-11-02 DIAGNOSIS — M25511 Pain in right shoulder: Secondary | ICD-10-CM | POA: Diagnosis not present

## 2023-11-05 DIAGNOSIS — M25511 Pain in right shoulder: Secondary | ICD-10-CM | POA: Diagnosis not present

## 2023-11-09 DIAGNOSIS — M25511 Pain in right shoulder: Secondary | ICD-10-CM | POA: Diagnosis not present

## 2023-11-12 DIAGNOSIS — M25511 Pain in right shoulder: Secondary | ICD-10-CM | POA: Diagnosis not present

## 2023-11-16 DIAGNOSIS — M25511 Pain in right shoulder: Secondary | ICD-10-CM | POA: Diagnosis not present

## 2023-11-19 DIAGNOSIS — M25511 Pain in right shoulder: Secondary | ICD-10-CM | POA: Diagnosis not present

## 2023-11-23 DIAGNOSIS — M25511 Pain in right shoulder: Secondary | ICD-10-CM | POA: Diagnosis not present

## 2023-11-26 DIAGNOSIS — M25511 Pain in right shoulder: Secondary | ICD-10-CM | POA: Diagnosis not present

## 2023-12-14 DIAGNOSIS — M25511 Pain in right shoulder: Secondary | ICD-10-CM | POA: Diagnosis not present

## 2024-03-22 DIAGNOSIS — Z853 Personal history of malignant neoplasm of breast: Secondary | ICD-10-CM | POA: Diagnosis not present

## 2024-03-22 DIAGNOSIS — M25511 Pain in right shoulder: Secondary | ICD-10-CM | POA: Diagnosis not present

## 2024-03-22 DIAGNOSIS — D369 Benign neoplasm, unspecified site: Secondary | ICD-10-CM | POA: Diagnosis not present

## 2024-03-22 DIAGNOSIS — E7800 Pure hypercholesterolemia, unspecified: Secondary | ICD-10-CM | POA: Diagnosis not present

## 2024-03-22 DIAGNOSIS — E039 Hypothyroidism, unspecified: Secondary | ICD-10-CM | POA: Diagnosis not present

## 2024-03-22 DIAGNOSIS — I10 Essential (primary) hypertension: Secondary | ICD-10-CM | POA: Diagnosis not present

## 2024-03-22 DIAGNOSIS — E1169 Type 2 diabetes mellitus with other specified complication: Secondary | ICD-10-CM | POA: Diagnosis not present
